# Patient Record
Sex: Female | Born: 1995 | Race: Asian | Hispanic: No | Marital: Married | Smoking: Never smoker
Health system: Southern US, Community
[De-identification: ages and names within clinical notes are randomized; demographics above are authoritative.]

## PROBLEM LIST (undated history)

## (undated) DIAGNOSIS — Z789 Other specified health status: Secondary | ICD-10-CM

## (undated) HISTORY — DX: Other specified health status: Z78.9

## (undated) HISTORY — PX: NO PAST SURGERIES: SHX2092

---

## 2020-09-30 ENCOUNTER — Other Ambulatory Visit: Payer: Self-pay | Admitting: Family Medicine

## 2020-09-30 DIAGNOSIS — Z3201 Encounter for pregnancy test, result positive: Secondary | ICD-10-CM

## 2020-09-30 NOTE — Progress Notes (Signed)
Patient newly arrived refugee, had positive UPT in [employer] [place] ~2-3 months ago.  Labs ordered, including lead and additional refugee labs.  Routing to AM back up preceptor for questions.  Terisa Starr, MD  Family Medicine Teaching Service

## 2020-10-01 ENCOUNTER — Other Ambulatory Visit: Payer: Self-pay

## 2020-10-01 DIAGNOSIS — Z3201 Encounter for pregnancy test, result positive: Secondary | ICD-10-CM | POA: Diagnosis not present

## 2020-10-03 LAB — URINE CULTURE, OB REFLEX

## 2020-10-03 LAB — CULTURE, OB URINE

## 2020-10-04 LAB — OBSTETRIC PANEL, INCLUDING HIV
Antibody Screen: NEGATIVE
Basophils Absolute: 0 10*3/uL (ref 0.0–0.2)
Basos: 1 %
EOS (ABSOLUTE): 0.1 10*3/uL (ref 0.0–0.4)
Eos: 2 %
HIV Screen 4th Generation wRfx: NONREACTIVE
Hematocrit: 32.5 % — ABNORMAL LOW (ref 34.0–46.6)
Hemoglobin: 11.3 g/dL (ref 11.1–15.9)
Hepatitis B Surface Ag: NEGATIVE
Immature Grans (Abs): 0 10*3/uL (ref 0.0–0.1)
Immature Granulocytes: 1 %
Lymphocytes Absolute: 1.6 10*3/uL (ref 0.7–3.1)
Lymphs: 27 %
MCH: 31.1 pg (ref 26.6–33.0)
MCHC: 34.8 g/dL (ref 31.5–35.7)
MCV: 90 fL (ref 79–97)
Monocytes Absolute: 0.4 10*3/uL (ref 0.1–0.9)
Monocytes: 6 %
Neutrophils Absolute: 3.8 10*3/uL (ref 1.4–7.0)
Neutrophils: 63 %
Platelets: 233 10*3/uL (ref 150–450)
RBC: 3.63 x10E6/uL — ABNORMAL LOW (ref 3.77–5.28)
RDW: 13.1 % (ref 11.7–15.4)
RPR Ser Ql: NONREACTIVE
Rh Factor: POSITIVE
Rubella Antibodies, IGG: 9.34 index (ref >0.99)
WBC: 5.9 10*3/uL (ref 3.4–10.8)

## 2020-10-04 LAB — LEAD, BLOOD (ADULT >= 16 YRS): Lead-Whole Blood: 3 ug/dL (ref 0–4)

## 2020-10-04 LAB — HGB FRACTIONATION CASCADE
Hgb A2: 2.4 % (ref 1.8–3.2)
Hgb A: 97.6 % (ref 96.4–98.8)
Hgb F: 0 % (ref 0.0–2.0)
Hgb S: 0 %

## 2020-10-04 LAB — HCV AB W REFLEX TO QUANT PCR: HCV Ab: <0.1 s/co ratio (ref 0.0–0.9)

## 2020-10-04 LAB — HCV INTERPRETATION

## 2020-10-15 ENCOUNTER — Ambulatory Visit (INDEPENDENT_AMBULATORY_CARE_PROVIDER_SITE_OTHER): Payer: Medicaid Other | Admitting: Family Medicine

## 2020-10-15 ENCOUNTER — Other Ambulatory Visit: Payer: Self-pay

## 2020-10-15 VITALS — BP 96/60 | HR 86 | Ht 64.37 in | Wt 148.4 lb

## 2020-10-15 DIAGNOSIS — Z3482 Encounter for supervision of other normal pregnancy, second trimester: Secondary | ICD-10-CM

## 2020-10-15 DIAGNOSIS — Z3490 Encounter for supervision of normal pregnancy, unspecified, unspecified trimester: Secondary | ICD-10-CM | POA: Insufficient documentation

## 2020-10-15 DIAGNOSIS — Z0289 Encounter for other administrative examinations: Secondary | ICD-10-CM | POA: Insufficient documentation

## 2020-10-15 DIAGNOSIS — Z3480 Encounter for supervision of other normal pregnancy, unspecified trimester: Secondary | ICD-10-CM | POA: Diagnosis not present

## 2020-10-15 MED ORDER — PRENATAL 19 PO CHEW: 1.0000 | CHEWABLE_TABLET | Freq: Every day | ORAL | 3 refills | Status: DC

## 2020-10-15 NOTE — Assessment & Plan Note (Signed)
25 yo woman in overall good health. Currently pregnant. Recommend COVID booster, otherwise UTD on vaccines.

## 2020-10-15 NOTE — Assessment & Plan Note (Addendum)
25 yo G2P1001. G1 approx September 2020 without complications, carried to term. Patient had ECV in hospital followed by NSVD without complications. Patient has not had LMP in 8 months, since traveling through [place] with [place] military after evacuation from [place]. She had a negative pregnancy test on arrival to [employer] [place] in [place], first positive pregnancy test on 08/23/20, subsequently positive again 10/01/20. Single live IUP with appropriate cardiac activity seen on bedside US on exam today (could not locate fetal HR with doptones alone)- this was not a dating exam, only viability. Will schedule patient for initial prenatal. EDD unknown, will schedule dating [place]. Parents are consanguinous- first cousins on maternal side. Patient getting hot, tired, nauseated in evening while fasting for Ramadan- recommend not fasting or breaking fast earlier. PNV given.

## 2020-10-15 NOTE — Patient Instructions (Addendum)
It was wonderful to see you today.  Please bring ALL of your medications with you to every visit.   Today we talked about:  Follow up on April 18 at 10:15 AM      Take your prescription to the pharmacy for your prenatal vitamin   Thank you for choosing [facility].   Please call [phone] with any questions about today's appointment.  Please be sure to schedule follow up at the front  desk before you leave today.   Terisa Starr, MD  Family Medicine

## 2020-10-15 NOTE — Progress Notes (Signed)
Patient Name: Alexandria Harris Date of Birth: [birth year] Date of Visit: 10/15/20 PCP: Westley Chandler, MD  Chief Complaint: refugee intake examination and recent positive pregnancy test  The patient's preferred language is Pashto. An interpreter was used for the entire visit.  Interpreter Name or ID: Ninfa Linden   Subjective: Alexandria Harris is a pleasant 25 y.o. presenting today for an initial refugee and immigrant clinic visit.   She is currently pregnant. She reports her last period was 8 months. She had some mild nausea and vomiting until 2 weeks ago. Denies vaginal bleeding, abdominal pain. She reports her appetite is okay.     ROS: occasional headaches at night, not presently; low appetite due to nausea from pregnancy; fasting for Ramadan, describes having "fever" at night.  PMH: None  PSH: None  FH: None Parents alive and in good health  Allergies:  NKDA  Current Medications: none  Social History: Tobacco Use: none Alcohol Use: none In the past two weeks, have you run out of food before you had money to purchase more? No In the past two weeks, have you had difficulty with obtaining food for your family? No  Refugee Information Number of Immediate Family Members: 2 Number of Immediate Family Members in [place]: 2 Date of Arrival: 03/16/20 (arrived to [place] 09/21/20) Country of Birth: [place] Country of Origin: [place] Location of Refugee Camp:  [employer] in [place]) Duration in [place]: 0-1 years Reason for Leaving Home Country: [place] Language: Other Other Primary Language:: Pashto Able to Read in Primary Language: Yes Able to Write in Primary Language: No Education: Primary School Marital Status: Married Sexual Activity: Yes Tuberculosis Screening Overseas: Negative Tuberculosis Screening Health Department: Negative Health Department Labs Completed: Yes History of Trauma: None Do You Feel Jumpy or Nervous?: No Are You Very Watchful or  'Super Alert'?: Yes   Date of Overseas Exam: 04/10/20 at [employer] Review of Overseas Exam: Yes Pre-Departure Treatment: no  [place]verse[place] Vaccines Reviewed and Updated in Epic Yes   Vitals:   10/15/20 0913  BP: 96/60  Pulse: 86  SpO2: 100%   HEENT: Sclera anicteric. Dentition is normal- no obvious caries. Appears well hydrated. Neck: Supple, no LAD Cardiac: Regular rate and rhythm. Normal S1/S2. No murmurs, rubs, or gallops appreciated. Lungs: Clear bilaterally to ascultation.  Abdomen: Normoactive bowel sounds. No tenderness to deep or light palpation. No rebound or guarding. No splenomegaly. Gravid uterus ~15 weeks. Extremities: Warm, well perfused without edema.  Skin: warm, dry, intact Psych: Pleasant and appropriate  MSK: full ROM of limbs  Encounter for health examination of refugee 25 yo woman in overall good health. Currently pregnant. Recommend COVID booster, otherwise UTD on vaccines.  Encounter for supervision of normal pregnancy 25 yo G2P1001. G1 approx September 2020 without complications, carried to term. Patient had ECV in hospital followed by NSVD without complications. Patient has not had LMP in 8 months, since traveling through [place] with [place] military after evacuation from [place]. She had a negative pregnancy test on arrival to [employer] [place] in [place], first positive pregnancy test on 08/23/20, subsequently positive again 10/01/20. Single live IUP with appropriate cardiac activity seen on bedside US on exam today (could not locate fetal HR with doptones alone)- this was not a dating exam, only viability. Will schedule patient for initial prenatal. EDD unknown, will schedule dating [place]. Parents are consanguinous- first cousins on maternal side. Patient getting hot, tired, nauseated in evening while fasting for Ramadan- recommend not fasting or breaking fast earlier.  PNV given.  I have personally updated the history tabs within Epic and included the refugee information in  social documentation.    Designated Market researcher signed with agency.   Release of information signed for Health Department.   Return to care in 1 month in [facility] with resident physician and PCP.   Vaccines: Covid booster  Seen and examined with Dr. Leary Roca. Bedside ultrasound confirms IUP, HR appropriate. I discussed the plan of care with the resident physician and agree with below documentation.  Terisa Starr, MD

## 2020-10-28 ENCOUNTER — Encounter: Payer: Medicaid Other | Admitting: Family Medicine

## 2020-10-28 NOTE — Progress Notes (Deleted)
Patient Name: Alexandria Harris Date of Birth: [birth year] [facility] Initial Prenatal Visit  Alexandria Harris is a 25 y.o. year old G2P1001 at Unknown who presents for her initial prenatal visit. Pregnancy {Is/is not:9024} planned She reports {pregnancy symptoms:18128}. She {is/is not:320031::"is"} taking a prenatal vitamin.  She denies pelvic pain or vaginal bleeding.   Pregnancy Dating: . The patient is dated by ***.  . LMP: *** . Period is certain:  {yes/no:20286}.  Marland Kitchen Periods were regular:  {yes/no:20286}.  Marland Kitchen LMP was a typical period:  {yes/no:20286}.  Marland Kitchen Using hormonal contraception in 3 months prior to conception: {yes/no:20286}  Lab Review: . Blood type: B . Rh Status: + . Antibody screen: Negative . HIV: Negative . RPR: Negative . Hemoglobin electrophoresis reviewed: Yes . Results of OB urine culture are: Negative . Rubella: Immune . Hep C Ab: Negative . Varicella status is Unknown  PMH: Reviewed and as detailed below: . HTN: {yes/no:20286::"No"}  . Gestational Hypertension/preeclampsia: {yes/no:20286::"No"}  . Type 1 or 2 Diabetes: {yes/no:20286::"No"}  . Depression:  {yes/no:20286::"No"}  . Seizure disorder:  {yes/no:20286::"No"} . VTE: {yes/no:20286::"No"} ,  . History of STI {yes/no:20286::"No"},  . Abnormal Pap smear:  {yes/no:20286::"No"}, . Genital herpes simplex:  {yes/no:20286::"No"}   PSH: . Gynecologic Surgery:  {No/  **:31982:o:"no"} . Surgical history reviewed, notable for: ***  Obstetric History: . Obstetric history tab updated and reviewed.  . Summary of prior pregnancies: *** . Cesarean delivery: {yes/no:20286::"No"}  . Gestational Diabetes:  {yes/no:20286::"No"} . Hypertension in pregnancy: {yes/no:20286::"No"} . History of preterm birth: {yes/no:20286::"No"} . History of LGA/SGA infant:  {yes/no:20286::"No"} . History of shoulder dystocia: {yes/no:20286::"No"} . Indications for referral were reviewed, and the patient has no  obstetric indications for referral to High Risk OB Clinic at this time.   Social History: . Partner's name: Alexandria Harris . Tobacco use: {yes/no:20286::"No"} . Alcohol use:  {yes/no:20286::"No"} . Other substance use:  {yes/no:20286::"No"}  Current Medications:  . ***  . Reviewed and appropriate in pregnancy.   Genetic and Infection Screen: . Flow Sheet Updated {yes/no:20286::"Yes"}  Prenatal Exam: Gen: Well nourished, well developed.  No distress.  Vitals noted. HEENT: Normocephalic, atraumatic.  Neck supple without cervical lymphadenopathy, thyromegaly or thyroid nodules.  Fair dentition. CV: RRR no murmur, gallops or rubs Lungs: CTA B.  Normal respiratory effort without wheezes or rales. Abd: soft, NTND. +BS.  Uterus not appreciated above pelvis. GU: Normal external female genitalia without lesions.  Nl vaginal, well rugated without lesions. No vaginal discharge.  Bimanual exam: No adnexal mass or TTP. No CMT.  Uterus size *** Ext: No clubbing, cyanosis or edema. Psych: Normal grooming and dress.  Not depressed or anxious appearing.  Normal thought content and process without flight of ideas or looseness of associations  Fetal heart tones: {appropriate:23337::"Appropriate"}  Assessment/Plan:  Alexandria Harris is a 25 y.o. G2P1001 at Unknown who presents to initiate prenatal care. She is doing well.  Current pregnancy issues include ***.  1. Routine prenatal care: Marland Kitchen As dating is not reliable, a dating ultrasound has been ordered. Scheduled for 4/21. Dating tab updated. . Pre-pregnancy weight updated. Expected weight gain this pregnancy is {weight gain pregnancy :23296::"25-35 pounds "} . Prenatal labs reviewed, notable for blood type B+, rubella immune, Hgb 11.3. . Indications for referral to HROB were reviewed and the patient does not meet criteria for referral.  . Medication list reviewed and updated.  . Recommended patient see a dentist for regular care.  . Bleeding and  pain precautions reviewed. . Importance of  prenatal vitamins reviewed.  . Genetic screening offered. Patient opted for: {obgeneticscreen:23414}. . The patient {DOES NOT does:27190::"does not"} have an indication for aspirin therapy beginning at 12-16 weeks. Aspirin {WAS/WAS [phone]::"was not"}  recommended today.  . The patient will not be age 66 or over at time of delivery. Referral to genetic counseling was not offered today.  . The patient has the following risk factors for preexisting diabetes: {Pre-existing diabetes screening:23343::"Reviewed indications for early 1 hour glucose testing, not indicated "}. An early 1 hour glucose tolerance test {WAS/WAS [phone]::"was not"} ordered. . Pregnancy Medical Home and PHQ-9 forms completed, problems noted: {yes/no:20286}  2. Pregnancy issues include the following which were addressed today:   Parents are consanguinous- first cousins on maternal side  Pap smear performed today***   Follow up 4 weeks for next prenatal visit.

## 2020-10-31 ENCOUNTER — Other Ambulatory Visit: Payer: Self-pay | Admitting: Family Medicine

## 2020-10-31 ENCOUNTER — Ambulatory Visit: Admission: RE | Admit: 2020-10-31 | Payer: Medicaid Other | Source: Ambulatory Visit

## 2020-10-31 ENCOUNTER — Ambulatory Visit: Payer: Medicaid Other | Attending: Family Medicine

## 2020-10-31 DIAGNOSIS — Z3482 Encounter for supervision of other normal pregnancy, second trimester: Secondary | ICD-10-CM

## 2020-11-11 NOTE — Progress Notes (Signed)
Patient Name: Alexandria Harris Date of Birth: 02/20/1996 [facility] Initial Prenatal Visit  Alexandria Harris is a 25 y.o. year old G2P1001 at Unknown who presents for her initial prenatal visit. Pashto interpreter present via telephone for entirety of visit. Pregnancy was not planned, but is very much desired. She reports no symptoms. She is not taking a prenatal vitamin.  She denies pelvic pain or vaginal bleeding.   Pregnancy Dating: . Dating is unknown. Had first positive pregnancy test 08/23/2020 . LMP: Unsure, approximately 8 months ago . Period is certain:  No.  . Periods were regular:  No.  . LMP was a typical period:  Yes.  . Using hormonal contraception in 3 months prior to conception: No  Lab Review: . Blood type: B . Rh Status: + . Antibody screen: Negative . HIV: Negative . RPR: Negative . Hemoglobin electrophoresis reviewed: Yes . Results of OB urine culture are: Negative . Rubella: Immune . Hep C Ab: Negative . Varicella status is Unknown  PMH: Reviewed and as detailed below: . HTN: No  . Gestational Hypertension/preeclampsia: No  . Type 1 or 2 Diabetes: No  . Depression:  No  . Seizure disorder:  No . VTE: No ,  . History of STI No,  . Abnormal Pap smear:  No, . Genital herpes simplex:  No   PSH: . Gynecologic Surgery:  none . Surgical history reviewed, notable for: none  Obstetric History: . Obstetric history tab updated and reviewed.  . Summary of prior pregnancies:  o G1 approx September 2020, no complications, vaginal delivery at term, delivered in [place] . Cesarean delivery: No  . Gestational Diabetes:  No  . Hypertension in pregnancy: No . History of preterm birth: No . History of LGA/SGA infant:  No . History of shoulder dystocia: No . Indications for referral were reviewed, and the patient has no obstetric indications for referral to High Risk OB Clinic at this time.   Social History: . Partner's name:  Sarita Haver . Tobacco use: No . Alcohol use:  No . Other substance use:  No  Current Medications:  . None   Genetic and Infection Screen: . Flow Sheet Updated Yes  Prenatal Exam: Gen: Well nourished, well developed.  No distress.  Vitals noted. HEENT: Normocephalic, atraumatic.  Neck supple.  Fair dentition. CV: RRR no murmur, gallops or rubs Lungs: CTA B.  Normal respiratory effort without wheezes or rales. Abd: soft, NTND. +BS.  Uterine fundus below the level of the umbilicus. GU: Patient declined exam Ext: No clubbing, cyanosis or edema. Psych: Normal grooming and dress.  Not depressed or anxious appearing.  Fetal heart tones: Appropriate, although dating is unknown  Assessment/Plan:  Alexandria Harris is a 25 y.o. G2P1001 at Unknown who presents to initiate prenatal care.  She is doing well overall. Of note, patient missed her appointment for dating ultrasound, so this has been re-scheduled for May 24th at 1:30pm  1. Routine prenatal care: Marland Kitchen As dating is not reliable, a dating ultrasound has been ordered and scheduled for May 24th at 1:30pm . Pre-pregnancy weight updated. Expected weight gain this pregnancy is 25-35 pounds  . Prenatal labs reviewed, unremarkable. . Indications for referral to HROB were reviewed and the patient does not meet criteria for referral.  . Medication list reviewed and updated.  . Bleeding and pain precautions reviewed. . Importance of prenatal vitamins reviewed. Rx printed for patient to bring to pharmacy again today. . Genetic screening not offered at this visit, as  dating is unknown at this time.  . The patient does not have an indication for aspirin therapy beginning at 12-16 weeks. Aspirin was not  recommended today.  . The patient will not be age 23 or over at time of delivery. Referral to genetic counseling was not offered today.  . The patient has the following risk factors for preexisting diabetes: Reviewed indications for early 1 hour glucose  testing, not indicated . An early 1 hour glucose tolerance test was not ordered. Marland Kitchen PHQ-9 form completed, problems noted: No  2. Refugee, language barrier  Will attempt to contact patient's case manager/sponsor to ensure patient attends her ultrasound appointment. Will also ensure patient was able to pick up PNV.   Follow up in 4 weeks for next prenatal visit.

## 2020-11-12 ENCOUNTER — Other Ambulatory Visit: Payer: Self-pay

## 2020-11-12 ENCOUNTER — Encounter: Payer: Self-pay | Admitting: Family Medicine

## 2020-11-12 ENCOUNTER — Ambulatory Visit (INDEPENDENT_AMBULATORY_CARE_PROVIDER_SITE_OTHER): Payer: Medicaid Other | Admitting: Family Medicine

## 2020-11-12 VITALS — BP 100/50 | HR 84 | Wt 153.0 lb

## 2020-11-12 DIAGNOSIS — Z3480 Encounter for supervision of other normal pregnancy, unspecified trimester: Secondary | ICD-10-CM

## 2020-11-12 DIAGNOSIS — Z3482 Encounter for supervision of other normal pregnancy, second trimester: Secondary | ICD-10-CM

## 2020-11-12 DIAGNOSIS — Z3A17 17 weeks gestation of pregnancy: Secondary | ICD-10-CM | POA: Diagnosis not present

## 2020-11-12 MED ORDER — PRENATAL 19 PO CHEW: 1.0000 | CHEWABLE_TABLET | Freq: Every day | ORAL | 3 refills | Status: AC

## 2020-11-12 NOTE — Patient Instructions (Addendum)
It was great to see you!  Please go to your pharmacy to pick up your prenatal vitamin. You should take one every day.  Take care and seek immediate care sooner if you develop any concerns.   Dr. Estil [facility]

## 2020-11-13 ENCOUNTER — Encounter: Payer: Self-pay | Admitting: Family Medicine

## 2020-11-19 ENCOUNTER — Encounter: Payer: Medicaid Other | Admitting: Family Medicine

## 2020-12-03 ENCOUNTER — Ambulatory Visit: Payer: Medicaid Other | Attending: Family Medicine

## 2020-12-13 ENCOUNTER — Encounter: Payer: Medicaid Other | Admitting: Family Medicine

## 2020-12-14 ENCOUNTER — Telehealth: Payer: Self-pay | Admitting: Family Medicine

## 2020-12-14 NOTE — Telephone Encounter (Signed)
Called patients husband and communicated with CWS Case worker about upcoming Ob visit. She will need her anatomy ultrasound rescheduled. This will also have to serve as her dating, sooner would be best.   Messaged interpreting to ask for in person Pashto interpreter.   Terisa Starr, MD  Family Medicine Teaching Service

## 2020-12-16 ENCOUNTER — Ambulatory Visit (INDEPENDENT_AMBULATORY_CARE_PROVIDER_SITE_OTHER): Payer: Medicaid Other | Admitting: Family Medicine

## 2020-12-16 ENCOUNTER — Other Ambulatory Visit: Payer: Self-pay

## 2020-12-16 VITALS — BP 100/50 | HR 89 | Wt 165.4 lb

## 2020-12-16 DIAGNOSIS — Z3490 Encounter for supervision of normal pregnancy, unspecified, unspecified trimester: Secondary | ICD-10-CM

## 2020-12-16 DIAGNOSIS — Z3A11 11 weeks gestation of pregnancy: Secondary | ICD-10-CM

## 2020-12-16 DIAGNOSIS — Z3482 Encounter for supervision of other normal pregnancy, second trimester: Secondary | ICD-10-CM

## 2020-12-16 DIAGNOSIS — Z3A22 22 weeks gestation of pregnancy: Secondary | ICD-10-CM

## 2020-12-16 NOTE — Assessment & Plan Note (Signed)
Best EDC at this time 04/21/21 based on fundal height 22cm on 12/16/20 [place] scheduled 01/09/21 Previous no-shows for [place] x2 due to language barrier and misunderstanding

## 2020-12-16 NOTE — Patient Instructions (Addendum)
It was wonderful to meet you today. Thank you for allowing me to be a part of your care. Below is a short summary of what we discussed at your visit today:   ?    .  ?~    ? ?     ~.  ?  ? ~    ?    ~?   :  Ultrasound Your ultrasound appointment is on Thursday, June 30 at 8:30am. It will be at the [facility].  A map is included below.        Next pregnancy appointment You will need to have an obstetric appointment after the ultrasound to review the results.   ? ? ? ? ? ?   ? ?~  ? ??.  Our office will call you in the next couple of weeks to make that appointment.    ?   ?          ?? ~?.  If you have any questions or concerns, please do not hesitate to contact us via phone or MyChart message.    ? ? ?   ?  ? ? MyChart    ??.  Fayette Pho, MD

## 2020-12-16 NOTE — Progress Notes (Signed)
  [facility] Prenatal Visit  Alexandria Harris is a 25 y.o. G2P1001 at approx 22.0 weeks here for routine follow up. She is dated by best clinical estimate (fundal height).  She reports no complaints.  She reports good fetal movement. No bleeding, loss of fluid, contractions. See flow sheet for details. Vitals:   12/16/20 0907  BP: (!) 100/50  Pulse: 89    A/P: Pregnancy at Unknown.  Doing well.   . Dating reviewed, dating tab is correct based on fundal height at this visit.  . Fetal heart tones Appropriate . Fundal height measured today and used for clinical dating.  . Anatomy ultrasound not yet performed - scheduled for 01/09/21 . Influenza vaccine not administered as not influenza season. .  . COVID vaccination previously administered (03/17/20, 04/10/20) . Indications for screening for preexisting diabetes include: Reviewed indications for early 1 hour glucose testing, not indicated .  Marland Kitchen Pregnancy education provided on the following topics: fetal growth and movement, ultrasound assessment, and upcoming laboratory assessment.   . Not yet scheduled for [facility] during second trimester, but will be scheduled for after her [place] on 01/09/21 (husband unsure of work schedule, could not tell us when they could come for appointment in early July).  . Preterm labor precautions given.   2. Pregnancy issues include the following and were addressed as appropriate today:  . Unknown gestational age. Per Ms. Wisenbaker, she believes she is in the 5th month pf pregnancy. Fundal height today 22 cm. Two previous no-shows for ultrasounds; asked patient and wife about this. No-shows appear to be due to language barrier and misunderstanding.  . Problem list and pregnancy box updated: Yes.   Ultrasound scheduled for 01/09/21. Explained appointment and reviewed information (maternal fetal medicine location, phone number) included in AVS. Husband able to read Pashto and Chad; AVS instructions  translated into Chad as Pashto not available through [employer] translate.   Will schedule follow up OB appt with [facility] as soon as possible after ultrasound.   Fayette Pho, MD

## 2021-01-09 ENCOUNTER — Ambulatory Visit: Payer: Medicaid Other | Admitting: *Deleted

## 2021-01-09 ENCOUNTER — Ambulatory Visit: Payer: Medicaid Other | Attending: Family Medicine

## 2021-01-09 ENCOUNTER — Other Ambulatory Visit: Payer: Self-pay | Admitting: Family Medicine

## 2021-01-09 ENCOUNTER — Encounter: Payer: Self-pay | Admitting: *Deleted

## 2021-01-09 ENCOUNTER — Other Ambulatory Visit: Payer: Self-pay

## 2021-01-09 VITALS — BP 102/57 | HR 74

## 2021-01-09 DIAGNOSIS — Z3A26 26 weeks gestation of pregnancy: Secondary | ICD-10-CM | POA: Diagnosis not present

## 2021-01-09 DIAGNOSIS — Z3687 Encounter for antenatal screening for uncertain dates: Secondary | ICD-10-CM | POA: Diagnosis not present

## 2021-01-09 DIAGNOSIS — Z3492 Encounter for supervision of normal pregnancy, unspecified, second trimester: Secondary | ICD-10-CM | POA: Diagnosis present

## 2021-01-09 DIAGNOSIS — Z363 Encounter for antenatal screening for malformations: Secondary | ICD-10-CM

## 2021-01-09 DIAGNOSIS — Z3482 Encounter for supervision of other normal pregnancy, second trimester: Secondary | ICD-10-CM

## 2021-09-03 IMAGING — US US MFM OB COMP +14 WKS
1 series · 13 of 28 positions shown · non-contrast
Comparison: none

[Series 1: us mfm ob comp +14 wks · 13 of 126 slices shown]
[im 5/126]
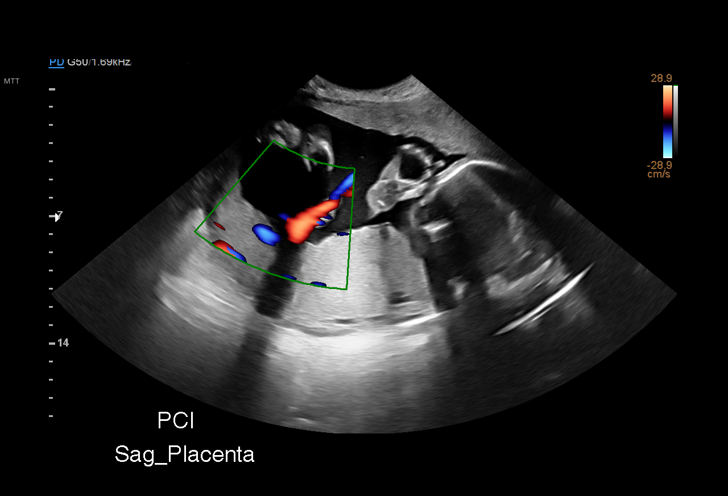
[im 14/126]
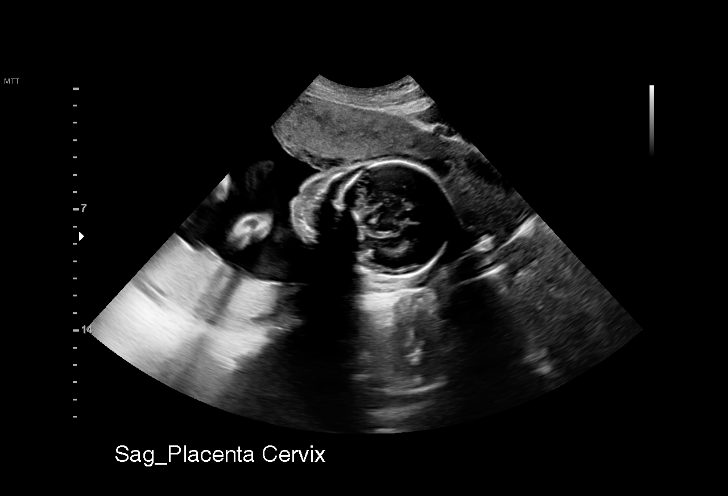
[im 24/126]
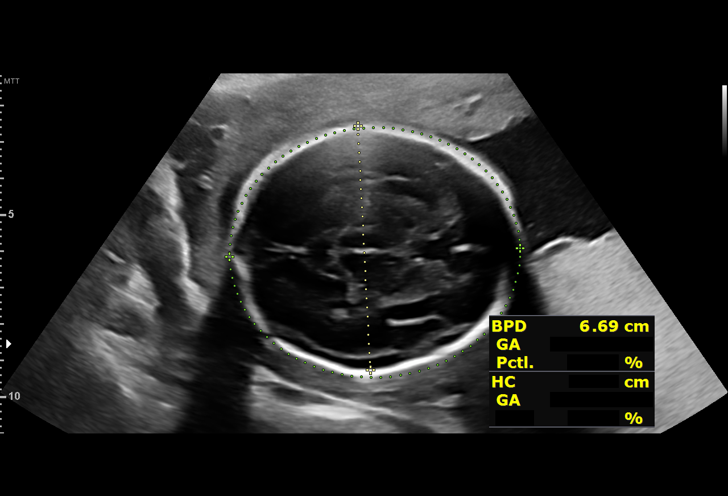
[im 33/126]
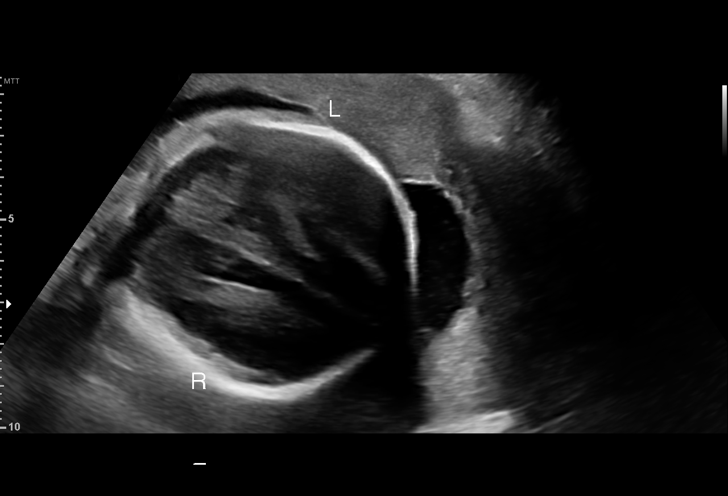
[im 42/126]
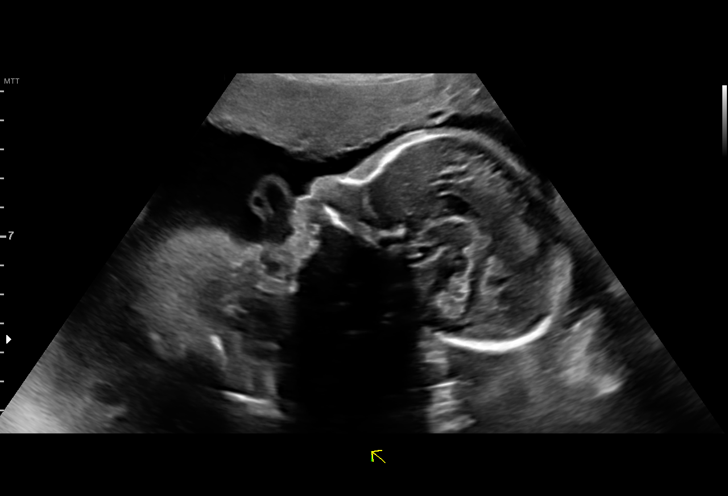
[im 51/126]
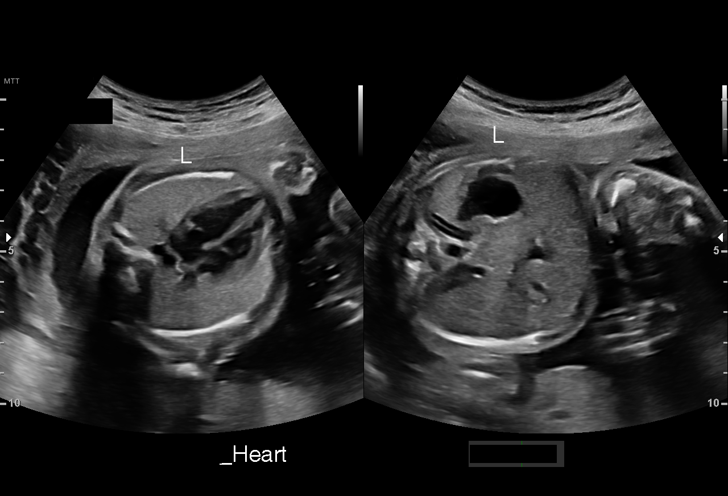
[im 65/126]
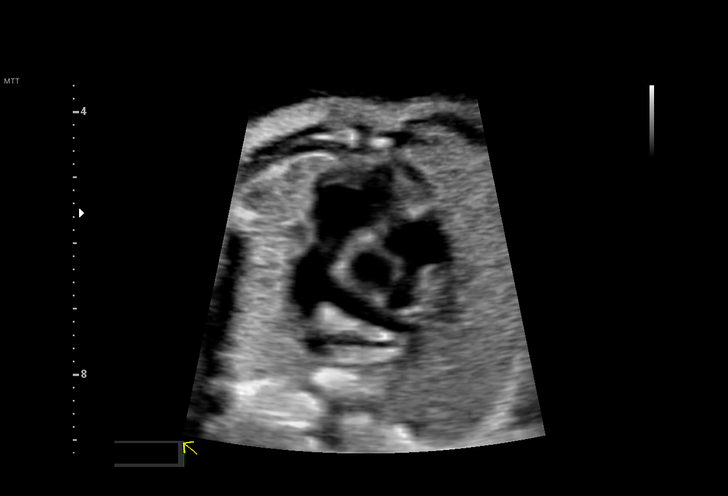
[im 75/126]
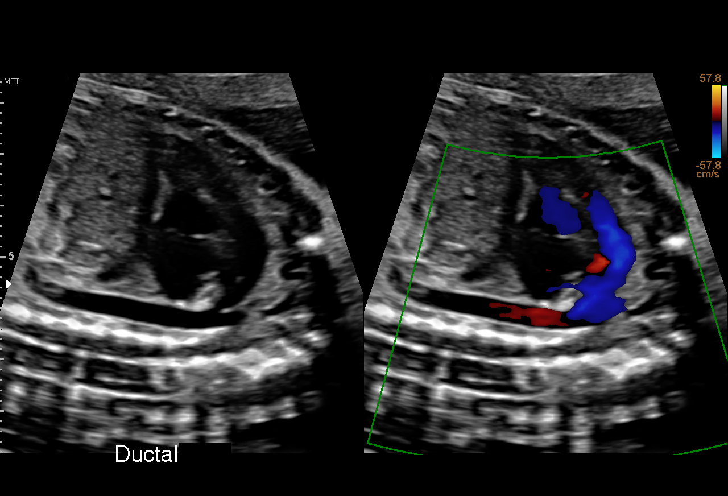
[im 84/126]
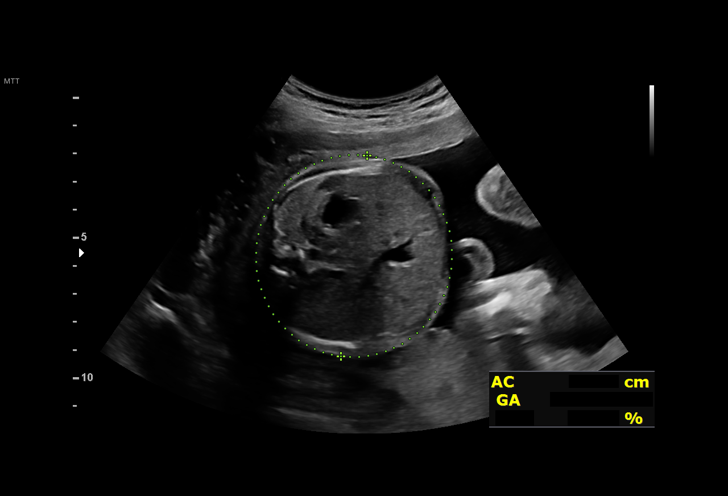
[im 93/126]
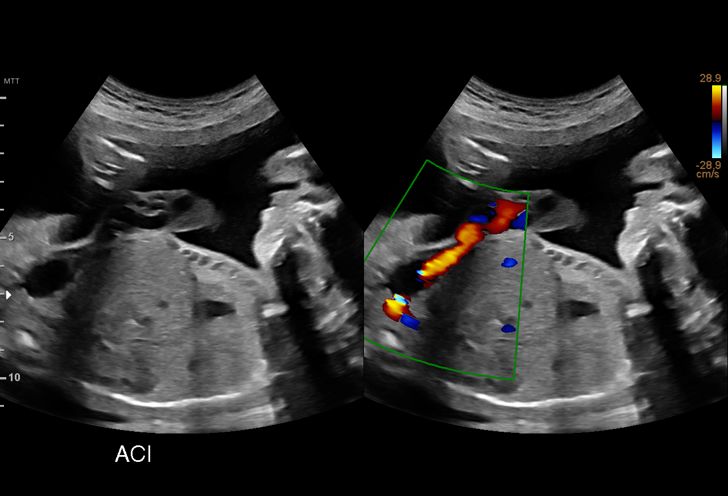
[im 102/126]
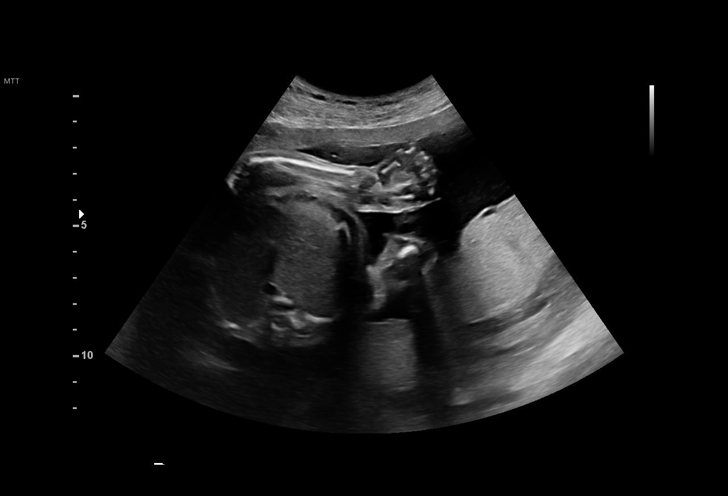
[im 112/126]
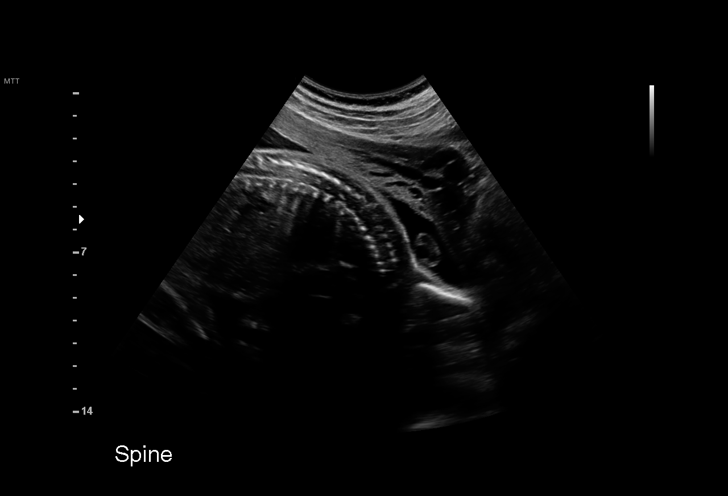
[im 121/126]
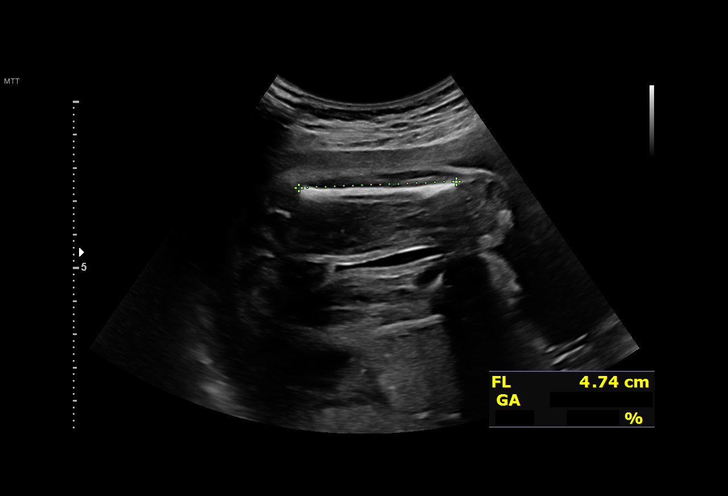

[13 of 28 positions shown; findings below may reference images not displayed]

Indications

 Encounter for antenatal screening for
 malformations
 Encounter for uncertain dates
 26 weeks gestation of pregnancy
Fetal Evaluation

 Num Of Fetuses:         1
 Fetal Heart Rate(bpm):  132
 Cardiac Activity:       Observed
 Presentation:           Cephalic
 Placenta:               Posterior
 P. Cord Insertion:      Visualized, central

 Amniotic Fluid
 AFI FV:      Within normal limits

                             Largest Pocket(cm)

Biometry

 BPD:      67.1  mm     G. Age:  27w 0d         66  %    CI:        82.66   %    70 - 86
                                                         FL/HC:      20.3   %    18.6 -
 HC:      232.8  mm     G. Age:  25w 2d          5  %    HC/AC:      1.05        1.04 -
 AC:      222.3  mm     G. Age:  26w 5d         53  %    FL/BPD:     70.5   %    71 - 87
 FL:       47.3  mm     G. Age:  25w 6d         22  %    FL/AC:      21.3   %    20 - 24
 HUM:        43  mm     G. Age:  25w 5d         32  %
 CER:      29.2  mm     G. Age:  25w 4d         36  %
 LV:          5  mm
 CM:        6.5  mm
 Est. FW:     911  gm           2 lb     37  %
OB History

 Blood Type:   B+
 Gravidity:    2         Term:   1        Prem:   0        SAB:   0
 TOP:          0       Ectopic:  0        Living: 1
Gestational Age

 U/S Today:     26w 2d                                        EDD:   04/15/21
 Best:          26w 2d     Det. By:  U/S (01/09/21)           EDD:   04/15/21
Anatomy

 Cranium:               Appears normal         LVOT:                   Appears normal
 Cavum:                 Appears normal         Aortic Arch:            Appears normal
 Ventricles:            Appears normal         Ductal Arch:            Appears normal
 Choroid Plexus:        Appears normal         Diaphragm:              Appears normal
 Cerebellum:            Appears normal         Stomach:                Appears normal, left
                                                                       sided
 Posterior Fossa:       Appears normal         Abdomen:                Appears normal
 Nuchal Fold:           Not applicable (>20    Abdominal Wall:         Appears nml (cord
                        wks GA)                                        insert, abd wall)
 Face:                  Appears normal         Cord Vessels:           Appears normal (3
                        (orbits and profile)                           vessel cord)
 Lips:                  Appears normal         Kidneys:                Appear normal
 Palate:                Appears normal         Bladder:                Appears normal
 Thoracic:              Appears normal         Spine:                  Appears normal
 Heart:                 Appears normal         Upper Extremities:      Appears normal
                        (4CH, axis, and
                        situs)
 RVOT:                  Appears normal         Lower Extremities:      Appears normal

 Other:  Fetus appears to be female. Lenses, 3VV and 3VTV visualized.
Cervix Uterus Adnexa

 Cervix
 Length:           4.77  cm.
 Normal appearance by transabdominal scan.

 Uterus
 No abnormality visualized.

 Right Ovary
 Within normal limits.

 Left Ovary
 Within normal limits.

 Cul De Sac
 No free fluid seen.

 Adnexa
 No abnormality visualized.
Impression

 Late prenatal care.
 G2 P1.  Patient recently moved from [place].  She is
 unsure of her dates.
 Obstetric history significant for a term vaginal delivery.
 She has not had screening for fetal aneuploidies.
 We performed a fetal anatomical survey.  Fetal biometry is
 consistent with 26 weeks and 2 days gestation.  Amniotic
 fluid is normal and good fetal activity seen.  No markers of
 aneuploidies. or fetal structural defects are seen.
 Patient prefers not to be scanned by a male physician.  I
 reviewed the images only and did not perform ultrasound.
 I counseled the patient with help of an interpreter present in
 the room.  I explained that we have assigned her EDD at
 04/15/2021.
Recommendations

 -An appointment was made for her to return in 4 weeks for
 fetal growth assessment.
                 Febian, Afuye
